# Patient Record
Sex: Female | Born: 1990 | Race: White | Marital: Married | Smoking: Never smoker
Health system: Southern US, Community
[De-identification: ages and names within clinical notes are randomized; demographics above are authoritative.]

## PROBLEM LIST (undated history)

## (undated) DIAGNOSIS — I10 Essential (primary) hypertension: Secondary | ICD-10-CM

## (undated) HISTORY — PX: WISDOM TOOTH EXTRACTION: SHX21

---

## 2017-03-09 ENCOUNTER — Emergency Department
Admission: EM | Admit: 2017-03-09 | Discharge: 2017-03-09 | Disposition: A | Discharge status: Home / Self Care | Payer: 59 | Attending: Emergency Medicine | Admitting: Emergency Medicine

## 2017-03-09 ENCOUNTER — Emergency Department: Payer: 59

## 2017-03-09 DIAGNOSIS — Z3A00 Weeks of gestation of pregnancy not specified: Secondary | ICD-10-CM | POA: Insufficient documentation

## 2017-03-09 DIAGNOSIS — O200 Threatened abortion: Secondary | ICD-10-CM | POA: Diagnosis not present

## 2017-03-09 DIAGNOSIS — I10 Essential (primary) hypertension: Secondary | ICD-10-CM

## 2017-03-09 DIAGNOSIS — N939 Abnormal uterine and vaginal bleeding, unspecified: Secondary | ICD-10-CM

## 2017-03-09 DIAGNOSIS — O209 Hemorrhage in early pregnancy, unspecified: Secondary | ICD-10-CM | POA: Diagnosis present

## 2017-03-09 HISTORY — DX: Essential (primary) hypertension: I10

## 2017-03-09 LAB — CBC WITH DIFFERENTIAL/PLATELET
BASOS ABS: 0 10*3/uL (ref 0–0.1)
BASOS PCT: 0 %
EOS ABS: 0.1 10*3/uL (ref 0–0.7)
Eosinophils Relative: 1 %
HCT: 37.8 % (ref 35.0–47.0)
HEMOGLOBIN: 12.8 g/dL (ref 12.0–16.0)
LYMPHS ABS: 1.7 10*3/uL (ref 1.0–3.6)
Lymphocytes Relative: 19 %
MCH: 28.5 pg (ref 26.0–34.0)
MCHC: 33.9 g/dL (ref 32.0–36.0)
MCV: 84 fL (ref 80.0–100.0)
Monocytes Absolute: 0.7 10*3/uL (ref 0.2–0.9)
Monocytes Relative: 8 %
NEUTROS PCT: 72 %
Neutro Abs: 6.2 10*3/uL (ref 1.4–6.5)
Platelets: 299 10*3/uL (ref 150–440)
RBC: 4.49 MIL/uL (ref 3.80–5.20)
RDW: 14.2 % (ref 11.5–14.5)
WBC: 8.8 10*3/uL (ref 3.6–11.0)

## 2017-03-09 LAB — COMPREHENSIVE METABOLIC PANEL
ALT: 14 U/L (ref 14–54)
AST: 27 U/L (ref 15–41)
Albumin: 3.8 g/dL (ref 3.5–5.0)
Alkaline Phosphatase: 59 U/L (ref 38–126)
Anion gap: 6 (ref 5–15)
BUN: 17 mg/dL (ref 6–20)
CHLORIDE: 106 mmol/L (ref 101–111)
CO2: 25 mmol/L (ref 22–32)
CREATININE: 0.79 mg/dL (ref 0.44–1.00)
Calcium: 8.7 mg/dL — ABNORMAL LOW (ref 8.9–10.3)
GFR calc Af Amer: >60 mL/min (ref >60)
Glucose, Bld: 93 mg/dL (ref 65–99)
Potassium: 5.1 mmol/L (ref 3.5–5.1)
Sodium: 137 mmol/L (ref 135–145)
Total Bilirubin: 0.5 mg/dL (ref 0.3–1.2)
Total Protein: 7.4 g/dL (ref 6.5–8.1)

## 2017-03-09 LAB — URINALYSIS, COMPLETE (UACMP) WITH MICROSCOPIC
Bacteria, UA: NONE SEEN
Bilirubin Urine: NEGATIVE
GLUCOSE, UA: NEGATIVE mg/dL
Ketones, ur: NEGATIVE mg/dL
LEUKOCYTES UA: NEGATIVE
Nitrite: NEGATIVE
PROTEIN: NEGATIVE mg/dL
Specific Gravity, Urine: 1.012 (ref 1.005–1.030)
pH: 6 (ref 5.0–8.0)

## 2017-03-09 LAB — CHLAMYDIA/NGC RT PCR (ARMC ONLY)
Chlamydia Tr: NOT DETECTED
N GONORRHOEAE: NOT DETECTED

## 2017-03-09 LAB — ABO/RH: ABO/RH(D): A POS

## 2017-03-09 LAB — WET PREP, GENITAL
Clue Cells Wet Prep HPF POC: NONE SEEN
SPERM: NONE SEEN
Trich, Wet Prep: NONE SEEN
Yeast Wet Prep HPF POC: NONE SEEN

## 2017-03-09 LAB — PREGNANCY, URINE: PREG TEST UR: POSITIVE — AB

## 2017-03-09 LAB — POCT PREGNANCY, URINE: PREG TEST UR: POSITIVE — AB

## 2017-03-09 LAB — HCG, QUANTITATIVE, PREGNANCY: hCG, Beta Chain, Quant, S: 157 m[IU]/mL — ABNORMAL HIGH (ref <5)

## 2017-03-09 NOTE — ED Triage Notes (Signed)
Pt c/o vaginal bleeding that started today, states she is unknown weeks pregnant , has not officially seen OB yet..Marland Kitchen

## 2017-03-09 NOTE — Consult Note (Signed)
Obstetrics & Gynecology Consult H&P    Consulting Department: Emergency Department  Consulting Physician: Corinna Capra, MD  Consulting Question: Possible ectopic pregnancy   History of Present Illness: Patient is a 26 y.o. G2P1001 with know early pregnancy presenting to the emergency department for vaginal bleeding.  The patient had previously been seen at an outside hospital system with inappropriate rise in HCG levels from 202 on 03/04/17 to 272 on 03/06/2017.    She presents to the emergency department today with a chief complaint of bleeding.  Denies abdominal pain.  She was on OCP until about a month ago.  This is a desired pregnancy.  The patient has not risk factors for ectopic pregnancy such as prior history of GC/CT, PID, endometriosis, or prior pelvic surgery.  Her prior pregnancy was uncomplicated other than blood pressure issues in the third trimester and ended in a SVD.  The patient is A positive per review of prior records.    Review of Systems:10 point review of systems  Past Medical History:  Past Medical History:  Diagnosis Date  . Hypertension    during pregnancy    Past Surgical History:  Past Surgical History:  Procedure Laterality Date  . WISDOM TOOTH EXTRACTION     Obstetric History: G2P1001  Family History:  No family history on file.  Social History:  Social History   Social History  . Marital status: Married    Spouse name: N/A  . Number of children: N/A  . Years of education: N/A   Occupational History  . Not on file.   Social History Main Topics  . Smoking status: Never Smoker  . Smokeless tobacco: Never Used  . Alcohol use No  . Drug use: Unknown  . Sexual activity: Not on file   Other Topics Concern  . Not on file   Social History Narrative  . No narrative on file    Allergies:  No Known Allergies  Medications: Prior to Admission medications   Not on File    Physical Exam Vitals: Blood pressure (!) 145/80, pulse 82,  temperature 98.1 F (36.7 C), temperature source Oral, resp. rate 11, height 5' 5"  (1.651 m), weight 225 lb (102.1 kg), SpO2 100 %. General: NAD HEENT: normocephalic, anicteric Pulmonary: No increased work of breathing Abdomen: NABS, soft, non-tender, non-distended, no rebound, no guarding Genitourinary: deferred Extremities: no edema, erythema, or tenderness Neurologic: Grossly intact Psychiatric: mood appropriate, affect full  Labs: Results for orders placed or performed during the hospital encounter of 03/09/17 (from the past 72 hour(s))  Comprehensive metabolic panel     Status: Abnormal   Collection Time: 03/09/17 11:21 AM  Result Value Ref Range   Sodium 137 135 - 145 mmol/L   Potassium 5.1 3.5 - 5.1 mmol/L    Comment: HEMOLYSIS AT THIS LEVEL MAY AFFECT RESULT   Chloride 106 101 - 111 mmol/L   CO2 25 22 - 32 mmol/L   Glucose, Bld 93 65 - 99 mg/dL   BUN 17 6 - 20 mg/dL   Creatinine, Ser 0.79 0.44 - 1.00 mg/dL   Calcium 8.7 (L) 8.9 - 10.3 mg/dL   Total Protein 7.4 6.5 - 8.1 g/dL   Albumin 3.8 3.5 - 5.0 g/dL   AST 27 15 - 41 U/L   ALT 14 14 - 54 U/L   Alkaline Phosphatase 59 38 - 126 U/L   Total Bilirubin 0.5 0.3 - 1.2 mg/dL   GFR calc non Af Amer >60 >60 mL/min  GFR calc Af Amer >60 >60 mL/min    Comment: (NOTE) The eGFR has been calculated using the CKD EPI equation. This calculation has not been validated in all clinical situations. eGFR's persistently <60 mL/min signify possible Chronic Kidney Disease.    Anion gap 6 5 - 15  CBC with Differential     Status: None   Collection Time: 03/09/17 11:21 AM  Result Value Ref Range   WBC 8.8 3.6 - 11.0 K/uL   RBC 4.49 3.80 - 5.20 MIL/uL   Hemoglobin 12.8 12.0 - 16.0 g/dL   HCT 37.8 35.0 - 47.0 %   MCV 84.0 80.0 - 100.0 fL   MCH 28.5 26.0 - 34.0 pg   MCHC 33.9 32.0 - 36.0 g/dL   RDW 14.2 11.5 - 14.5 %   Platelets 299 150 - 440 K/uL   Neutrophils Relative % 72 %   Neutro Abs 6.2 1.4 - 6.5 K/uL   Lymphocytes Relative  19 %   Lymphs Abs 1.7 1.0 - 3.6 K/uL   Monocytes Relative 8 %   Monocytes Absolute 0.7 0.2 - 0.9 K/uL   Eosinophils Relative 1 %   Eosinophils Absolute 0.1 0 - 0.7 K/uL   Basophils Relative 0 %   Basophils Absolute 0.0 0 - 0.1 K/uL  Pregnancy, urine     Status: Abnormal   Collection Time: 03/09/17 11:21 AM  Result Value Ref Range   Preg Test, Ur POSITIVE (A) NEGATIVE  Urinalysis, Complete w Microscopic     Status: Abnormal   Collection Time: 03/09/17 11:21 AM  Result Value Ref Range   Color, Urine YELLOW (A) YELLOW   APPearance CLEAR (A) CLEAR   Specific Gravity, Urine 1.012 1.005 - 1.030   pH 6.0 5.0 - 8.0   Glucose, UA NEGATIVE NEGATIVE mg/dL   Hgb urine dipstick MODERATE (A) NEGATIVE   Bilirubin Urine NEGATIVE NEGATIVE   Ketones, ur NEGATIVE NEGATIVE mg/dL   Protein, ur NEGATIVE NEGATIVE mg/dL   Nitrite NEGATIVE NEGATIVE   Leukocytes, UA NEGATIVE NEGATIVE   RBC / HPF 0-5 0 - 5 RBC/hpf   WBC, UA 0-5 0 - 5 WBC/hpf   Bacteria, UA NONE SEEN NONE SEEN   Squamous Epithelial / LPF 0-5 (A) NONE SEEN  hCG, quantitative, pregnancy     Status: Abnormal   Collection Time: 03/09/17 11:21 AM  Result Value Ref Range   hCG, Beta Chain, Quant, S 157 (H) <5 mIU/mL    Comment:          GEST. AGE      CONC.  (mIU/mL)   <=1 WEEK        5 - 50     2 WEEKS       50 - 500     3 WEEKS       100 - 10,000     4 WEEKS     1,000 - 30,000     5 WEEKS     3,500 - 115,000   6-8 WEEKS     12,000 - 270,000    12 WEEKS     15,000 - 220,000        FEMALE AND NON-PREGNANT FEMALE:     LESS THAN 5 mIU/mL   ABO/Rh     Status: None   Collection Time: 03/09/17 11:21 AM  Result Value Ref Range   ABO/RH(D) A POS   Pregnancy, urine POC     Status: Abnormal   Collection Time: 03/09/17 11:32 AM  Result Value  Ref Range   Preg Test, Ur POSITIVE (A) NEGATIVE    Comment:        THE SENSITIVITY OF THIS METHODOLOGY IS >24 mIU/mL   Wet prep, genital     Status: Abnormal   Collection Time: 03/09/17 11:37 AM    Result Value Ref Range   Yeast Wet Prep HPF POC NONE SEEN NONE SEEN   Trich, Wet Prep NONE SEEN NONE SEEN   Clue Cells Wet Prep HPF POC NONE SEEN NONE SEEN   WBC, Wet Prep HPF POC TOO NUMEROUS TO COUNT (A) NONE SEEN   Sperm NONE SEEN   Chlamydia/NGC rt PCR ([facility] only)     Status: None   Collection Time: 03/09/17 11:37 AM  Result Value Ref Range   Specimen source GC/Chlam ENDOCERVICAL    Chlamydia Tr NOT DETECTED NOT DETECTED   N gonorrhoeae NOT DETECTED NOT DETECTED    Comment: (NOTE) 100  This methodology has not been evaluated in pregnant women or in 200  patients with a history of hysterectomy. 300 400  This methodology will not be performed on patients less than 42  years of age.     Imaging US Ob Comp Less 14 Wks  Result Date: 03/09/2017 CLINICAL DATA:  26 year old pregnant female presenting with vaginal bleeding since this morning. Quantitative beta HCG 175. Uncertain LMP. EXAM: OBSTETRIC <14 WK [place] AND TRANSVAGINAL OB US TECHNIQUE: Both transabdominal and transvaginal ultrasound examinations were performed for complete evaluation of the gestation as well as the maternal uterus, adnexal regions, and pelvic cul-de-sac. Transvaginal technique was performed to assess early pregnancy. COMPARISON:  None. FINDINGS: Uterus is retroverted. No intrauterine gestational sac. Bilayer endometrial thickness 8 mm. No endometrial cavity fluid or focal endometrial mass. No uterine fibroids or other myometrial abnormality is demonstrated . Right ovary measures 3.6 x 2.0 x 2.2 cm. There is a mixed echogenicity 2.0 x 0.8 x 1.4 cm right adnexal mass located between the right ovary and uterus with tiny internal cystic spaces and mild internal vascularity on color Doppler. No discrete embryo, yolk sac or embryonic cardiac activity detected within the right adnexal mass. Left ovary measures 3.8 x 2.0 x 1.6 cm. No abnormal left ovarian or left adnexal mass. No abnormal free fluid in the pelvis. IMPRESSION: 1.  No intrauterine gestation. 2. Mixed echogenicity 2.0 x 0.8 x 1.4 cm right adnexal mass located between the right ovary and uterus, suspicious for a right tubal ectopic gestation. No discrete embryo or yolk sac detected within the right adnexal mass. 3. No abnormal free fluid in the pelvis. These results were called by telephone at the time of interpretation on 03/09/2017 at 1:08 pm to Dr. Conni Slipper , who verbally acknowledged these results. Electronically Signed   By: Ilona Sorrel M.D.   On: 03/09/2017 13:11   US Ob Transvaginal  Result Date: 03/09/2017 CLINICAL DATA:  26 year old pregnant female presenting with vaginal bleeding since this morning. Quantitative beta HCG 175. Uncertain LMP. EXAM: OBSTETRIC <14 WK [place] AND TRANSVAGINAL OB US TECHNIQUE: Both transabdominal and transvaginal ultrasound examinations were performed for complete evaluation of the gestation as well as the maternal uterus, adnexal regions, and pelvic cul-de-sac. Transvaginal technique was performed to assess early pregnancy. COMPARISON:  None. FINDINGS: Uterus is retroverted. No intrauterine gestational sac. Bilayer endometrial thickness 8 mm. No endometrial cavity fluid or focal endometrial mass. No uterine fibroids or other myometrial abnormality is demonstrated . Right ovary measures 3.6 x 2.0 x 2.2 cm. There is a mixed echogenicity  2.0 x 0.8 x 1.4 cm right adnexal mass located between the right ovary and uterus with tiny internal cystic spaces and mild internal vascularity on color Doppler. No discrete embryo, yolk sac or embryonic cardiac activity detected within the right adnexal mass. Left ovary measures 3.8 x 2.0 x 1.6 cm. No abnormal left ovarian or left adnexal mass. No abnormal free fluid in the pelvis. IMPRESSION: 1. No intrauterine gestation. 2. Mixed echogenicity 2.0 x 0.8 x 1.4 cm right adnexal mass located between the right ovary and uterus, suspicious for a right tubal ectopic gestation. No discrete embryo or yolk sac  detected within the right adnexal mass. 3. No abnormal free fluid in the pelvis. These results were called by telephone at the time of interpretation on 03/09/2017 at 1:08 pm to Dr. Conni Slipper , who verbally acknowledged these results. Electronically Signed   By: Ilona Sorrel M.D.   On: 03/09/2017 13:11    Assessment: 26 y.o. G2P1001 with pregnancy of unknown location  Plan: 1) Pregnancy of unknown location: - recommend continued trending of HCG since we are seeing level drop - Rh positive rhogam is not indicated - If plateau in level patient is a MTX candidate, we also discussed salpingostomy, and salpingectomy.  Ultrasound images independently reviewed by myself showing EMS of 17m, the right adenxa contains a simple cyst,  No significant blood flow, no free fluid - Ectopic precautions given in regard to dizziness, worsening bleeding, or development of acute onset abdominal pain which warrant re-presentation  The patient does not have identifiable risk factors which would increase her risk of ectopic pregnancy (Approximately 50% of all patient presenting with ectopic pregnancy will have identifiable risk factors such as prior GC/CT, PID, or ART).  The discriminatory zone for visualizing a singleton intrauterine pregnancy is an HCG level of approximately 15032m/mL to 250032mmL, although the recommended value to rule out an intrauterine pregnancy has been set conservatively high at 3500m65mL to account for twin gestations which occur at a rate of approximately 2% of all spontaneous conceptions.  Serial HCG measurements are more helpful in determining the patients risk of ectopic than a single value.  Demonstration of adnexal mass with a hypoechoic center on ultrasound has a positive predictive value of approximately 80% and may misidentify corpus luteum cysts, paratubal cysts, and or hydrosalpinx among others.  An intrauterine gestational sac makes ectopic pregnancy less likely but may represent  pseudosac.  The diagnosis of intrauterine pregnancy requires documentation of an intrauterine gestational sac with accompanying yolk sac or fetal pole.  Heterotopic pregnancy is exceedingly rare but may still be present in cases of confirmed intrauterine pregnancy with a reported incidence of 1 in 4,000 to 1 in 30,000.   Ruptured ectopic pregnancy continued to account for 2.7% of all maternal deaths, and the incidence of ectopic pregnancy is reported at approximately 2% of all spontaneous conceptions.  The patient understand the importance of returning for repeat HCG measurement in 48-hrs, as well as follow up ultrasound.  She is currently hemodynamically stable, with a non-acute pelvic and abdominal exam, and reliable.  This is a desired pregnancy.  She was given strict precautions should she develop acute abdominal pain and or orthostatic symptoms.  ACOG Practice Bulletin 191 February 2018 "Ectopic Pregnancy"  [email or web address]

## 2017-03-09 NOTE — Discharge Instructions (Signed)
[facility]. Please be sure to go to the emergency room immediately for heavy bleeding worse pain feeling lightheaded etc. When you go to see your OB doctor please take the records and the CD with the ultrasound pictures on it which you were given here. Please follow all the advice of Dr. Chauncey CruelStabler gave you. Remember this could still be an ectopic pregnancy (pregnancy in the tubes).

## 2017-03-09 NOTE — ED Provider Notes (Signed)
[facility] Emergency Department Provider Note   ____________________________________________   First MD Initiated Contact with Patient 03/09/17 1058     (approximate)  I have reviewed the triage vital signs and the nursing notes.   HISTORY  Chief Complaint Vaginal Bleeding    HPI Alicia Pratt is a 26 y.o. female who is visiting from [place]. She reports she noticed some vaginal bleeding this morning. Was more than just spotting but not much more. She reports she had some bleeding for a few days in February which she thought she was her menstrual period but then discovered she was pregnant about 2-3 days later. She reports she had bleeding with her first pregnancy and was diagnosed as having low progesterone levels. She had to take progesterone suppositories. At pregnancy ended successfully and her baby boy is almost 28-year-old.Patient says her belly slightly achy but she is not having any cramping nausea vomiting fevers or any other more serious pain.  Past Medical History:  Diagnosis Date  . Hypertension    during pregnancy    There are no active problems to display for this patient.   Past Surgical History:  Procedure Laterality Date  . WISDOM TOOTH EXTRACTION      Prior to Admission medications   Not on File    Allergies Patient has no known allergies.  No family history on file.  Social History Social History  Substance Use Topics  . Smoking status: Never Smoker  . Smokeless tobacco: Never Used  . Alcohol use No    Review of Systems Constitutional: No fever/chills Eyes: No visual changes. ENT: No sore throat. Cardiovascular: Denies chest pain. Respiratory: Denies shortness of breath. Gastrointestinal:   No nausea, no vomiting.  No diarrhea.  No constipation. Genitourinary: Negative for dysuria. Musculoskeletal: Negative for back pain. Skin: Negative for rash. Neurological: Negative for headaches, focal weakness or  numbness.  10-point ROS otherwise negative.  ____________________________________________   PHYSICAL EXAM:  VITAL SIGNS: ED Triage Vitals  Enc Vitals Group     BP 03/09/17 1053 130/78     Pulse Rate 03/09/17 1051 94     Resp 03/09/17 1051 18     Temp 03/09/17 1051 98.1 F (36.7 C)     Temp Source 03/09/17 1051 Oral     SpO2 03/09/17 1051 98 %     Weight 03/09/17 1051 225 lb (102.1 kg)     Height 03/09/17 1051 5\' 5"  (1.651 m)     Head Circumference --      Peak Flow --      Pain Score 03/09/17 1052 1     Pain Loc --      Pain Edu? --      Excl. in GC? --    Constitutional: Alert and oriented. Well appearing and in no acute distress. Eyes: Conjunctivae are normal. PERRL. EOMI. Head: Atraumatic. Nose: No congestion/rhinnorhea. Mouth/Throat: Mucous membranes are moist.  Oropharynx non-erythematous. Neck: No stridor.  Cardiovascular: Normal rate, regular rhythm. Grossly normal heart sounds.  Good peripheral circulation. Respiratory: Normal respiratory effort.  No retractions. Lungs CTAB. Gastrointestinal: Soft and nontender. No distention. No abdominal bruits. No CVA tenderness. Genitourinary: Normal peritoneum there is a scant amount of blood in the vagina but no discharge visible. Cervix is closed there are no palpable masses and no cervical motion tenderness no adnexal tenderness  Musculoskeletal: No lower extremity tenderness nor edema.  No joint effusions. Neurologic:  Normal speech and language. No gross focal neurologic deficits are appreciated. No  gait instability. Skin:  Skin is warm, dry and intact. No rash noted.  ____________________________________________   LABS (all labs ordered are listed, but only abnormal results are displayed)  Labs Reviewed  WET PREP, GENITAL - Abnormal; Notable for the following:       Result Value   WBC, Wet Prep HPF POC TOO NUMEROUS TO COUNT (*)    All other components within normal limits  COMPREHENSIVE METABOLIC PANEL -  Abnormal; Notable for the following:    Calcium 8.7 (*)    All other components within normal limits  PREGNANCY, URINE - Abnormal; Notable for the following:    Preg Test, Ur POSITIVE (*)    All other components within normal limits  URINALYSIS, COMPLETE (UACMP) WITH MICROSCOPIC - Abnormal; Notable for the following:    Color, Urine YELLOW (*)    APPearance CLEAR (*)    Hgb urine dipstick MODERATE (*)    Squamous Epithelial / LPF 0-5 (*)    All other components within normal limits  HCG, QUANTITATIVE, PREGNANCY - Abnormal; Notable for the following:    hCG, Beta Chain, Quant, S 157 (*)    All other components within normal limits  POCT PREGNANCY, URINE - Abnormal; Notable for the following:    Preg Test, Ur POSITIVE (*)    All other components within normal limits  CHLAMYDIA/NGC RT PCR ([facility] ONLY)  CBC WITH DIFFERENTIAL/PLATELET  ABO/RH   ____________________________________________  EKG   ____________________________________________  RADIOLOGY  tudy Result   CLINICAL DATA:  26 year old pregnant female presenting with vaginal bleeding since this morning. Quantitative beta HCG 175. Uncertain LMP.  EXAM: OBSTETRIC <14 WK [place] AND TRANSVAGINAL OB US  TECHNIQUE: Both transabdominal and transvaginal ultrasound examinations were performed for complete evaluation of the gestation as well as the maternal uterus, adnexal regions, and pelvic cul-de-sac. Transvaginal technique was performed to assess early pregnancy.  COMPARISON:  None.  FINDINGS: Uterus is retroverted. No intrauterine gestational sac. Bilayer endometrial thickness 8 mm. No endometrial cavity fluid or focal endometrial mass. No uterine fibroids or other myometrial abnormality is demonstrated .  Right ovary measures 3.6 x 2.0 x 2.2 cm. There is a mixed echogenicity 2.0 x 0.8 x 1.4 cm right adnexal mass located between the right ovary and uterus with tiny internal cystic spaces and  mild internal vascularity on color Doppler. No discrete embryo, yolk sac or embryonic cardiac activity detected within the right adnexal mass.  Left ovary measures 3.8 x 2.0 x 1.6 cm. No abnormal left ovarian or left adnexal mass. No abnormal free fluid in the pelvis.  IMPRESSION: 1. No intrauterine gestation. 2. Mixed echogenicity 2.0 x 0.8 x 1.4 cm right adnexal mass located between the right ovary and uterus, suspicious for a right tubal ectopic gestation. No discrete embryo or yolk sac detected within the right adnexal mass. 3. No abnormal free fluid in the pelvis. These results were called by telephone at the time of interpretation on 03/09/2017 at 1:08 pm to Dr. Dorothea Glassman , who verbally acknowledged these results.   Electronically Signed   By: Delbert Phenix M.D.   On: 03/09/2017 13:11     ____________________________________________   PROCEDURES  Procedure(s) performed:  Procedures  Critical Care performed:   ____________________________________________   INITIAL IMPRESSION / ASSESSMENT AND PLAN / ED COURSE  Pertinent labs & imaging results that were available during my care of the patient were reviewed by me and considered in my medical decision making (see chart for details).   Patient seen  by Dr. Chauncey Cruel GYN, feels the patient is okay to go home at present. He is not sure what this mass is. Patient and her husband  understand the risk this could still protect perhaps be an ectopic pregnancy as he explained to them.      ____________________________________________   FINAL CLINICAL IMPRESSION(S) / ED DIAGNOSES  Final diagnoses:  Vaginal bleeding  Threatened miscarriage      NEW MEDICATIONS STARTED DURING THIS VISIT:  New Prescriptions   No medications on file     Note:  This document was prepared using Dragon voice recognition software and may include unintentional dictation errors.    Arnaldo Natal, MD 03/09/17 1409

## 2017-05-17 IMAGING — US US OB COMP LESS 14 WK
1 series · 13 of 28 positions shown · non-contrast
Comparison: None.

CLINICAL DATA: 25-year-old pregnant female presenting with vaginal
bleeding since this morning. Quantitative beta HCG 175. Uncertain
LMP.

EXAM:
OBSTETRIC <14 WK US AND TRANSVAGINAL OB US
TECHNIQUE: Both transabdominal and transvaginal ultrasound examinations were
performed for complete evaluation of the gestation as well as the
maternal uterus, adnexal regions, and pelvic cul-de-sac.
Transvaginal technique was performed to assess early pregnancy.

[Series 1: us ob comp less 14 wk · 0.25mm/px · 90 acquisitions, 13 frames shown]
[im 4/90]
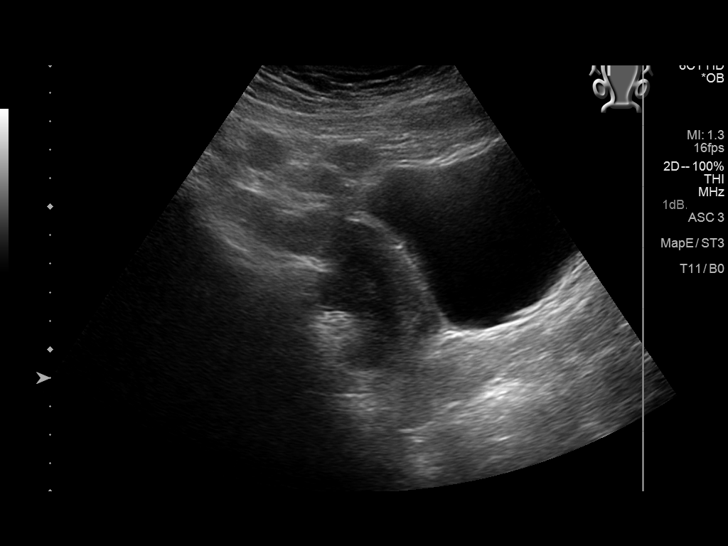
[im 10/90]
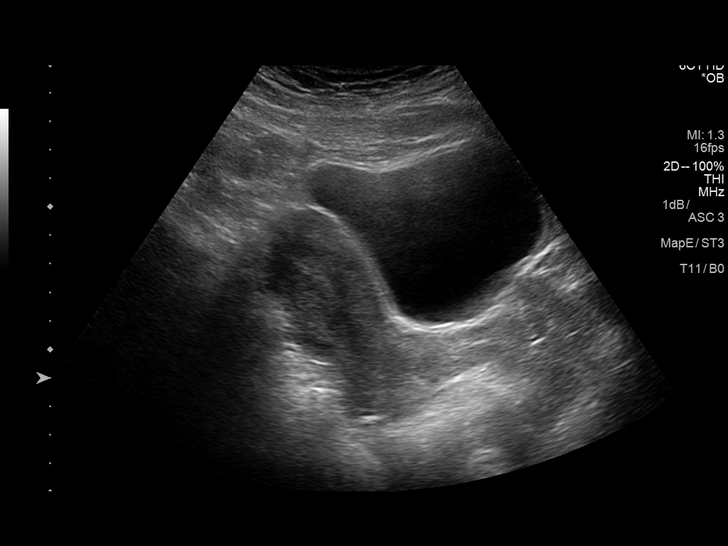
[im 17/90]
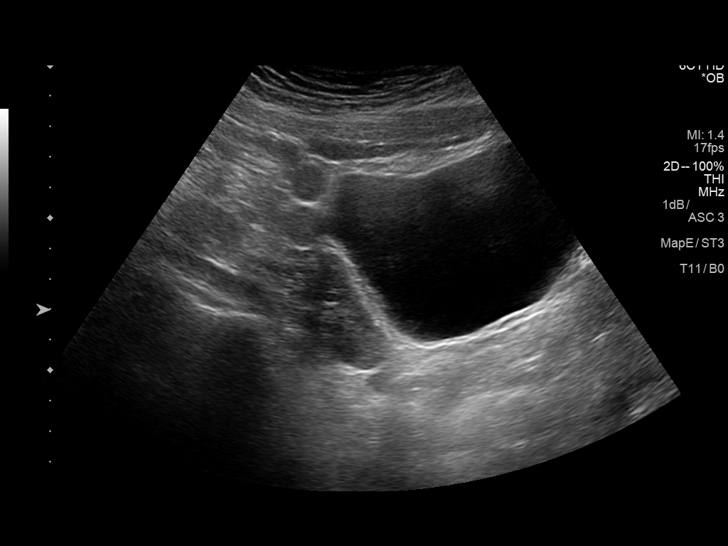
[im 24/90]
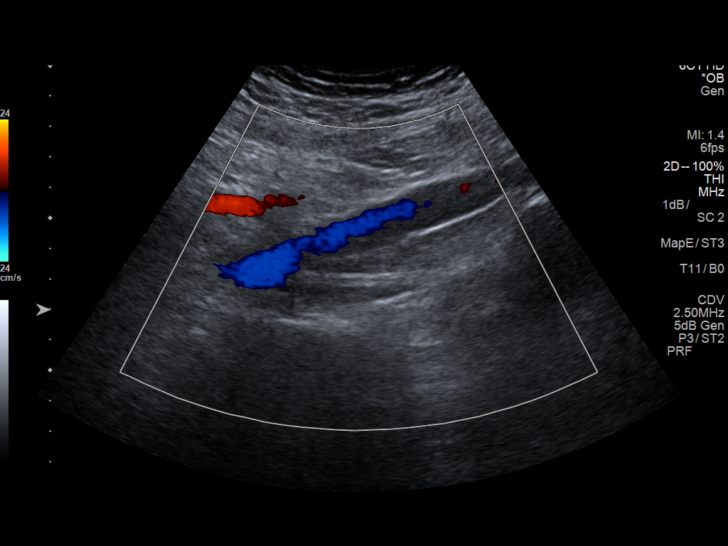
[im 30/90]
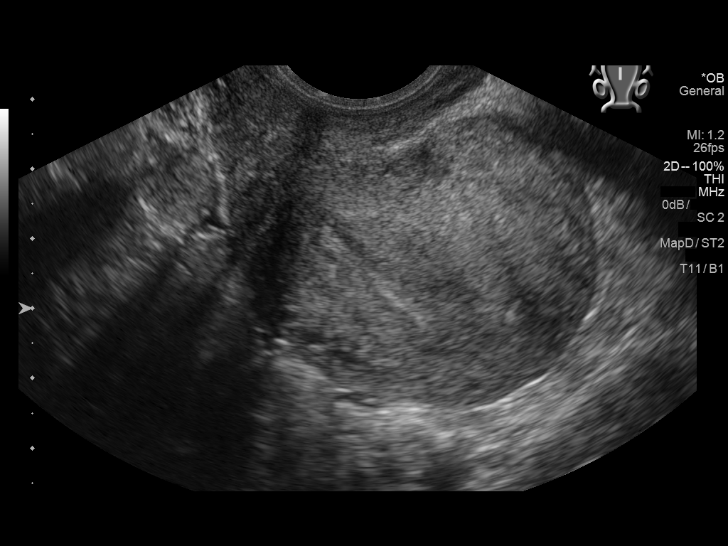
[im 37/90]
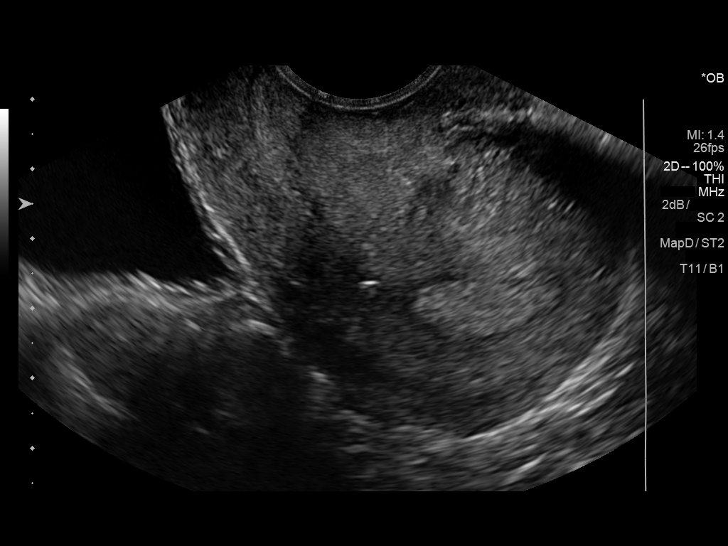
[im 47/90]
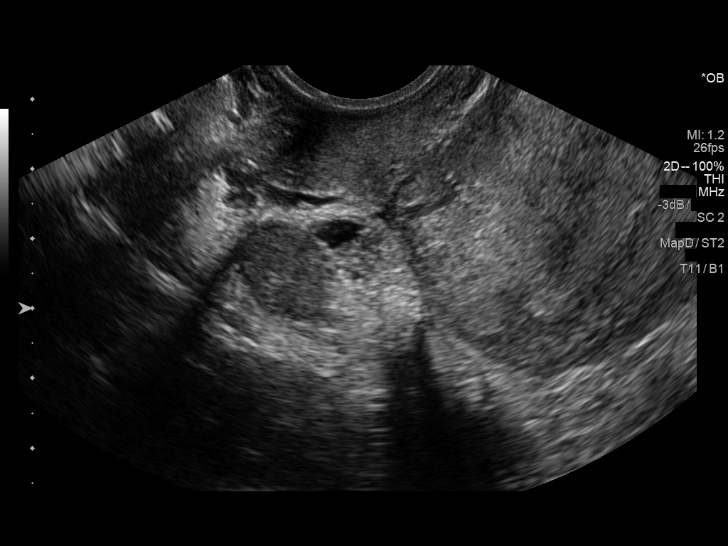
[im 53/90]
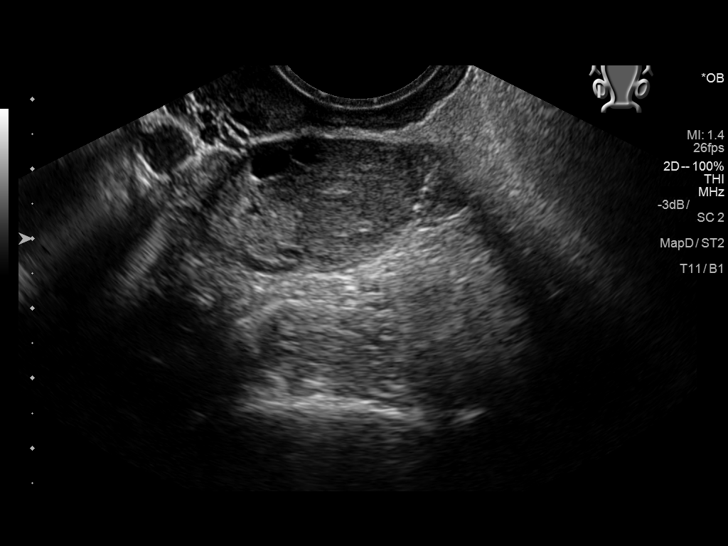
[im 60/90]
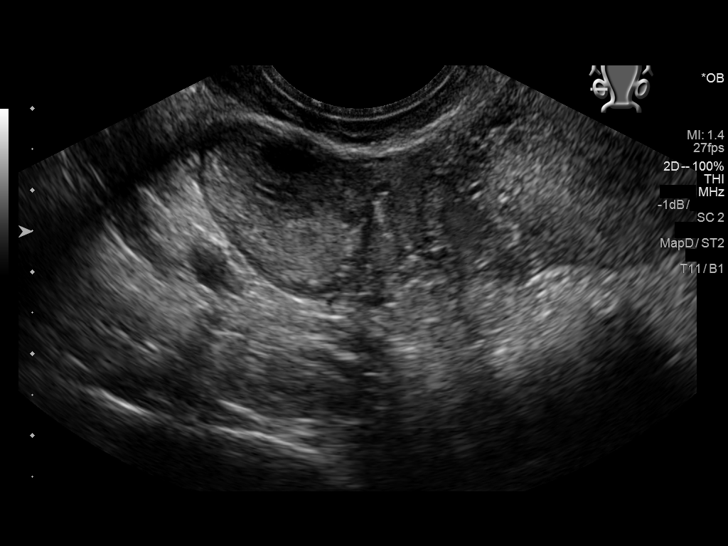
[im 66/90]
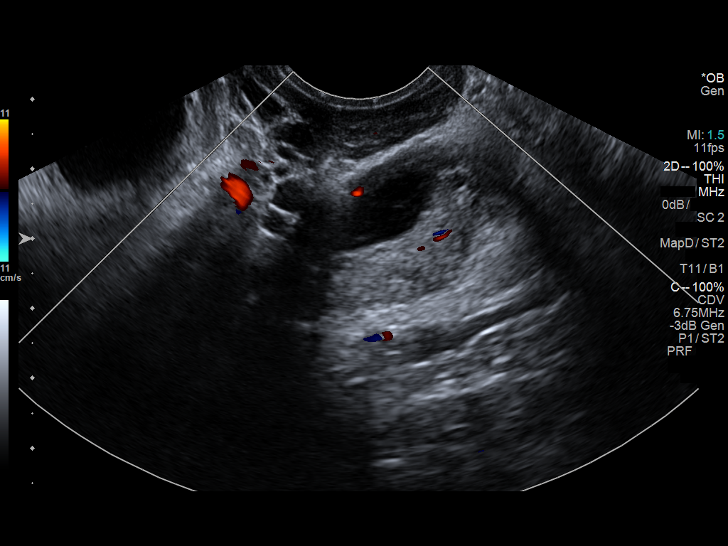
[im 73/90]
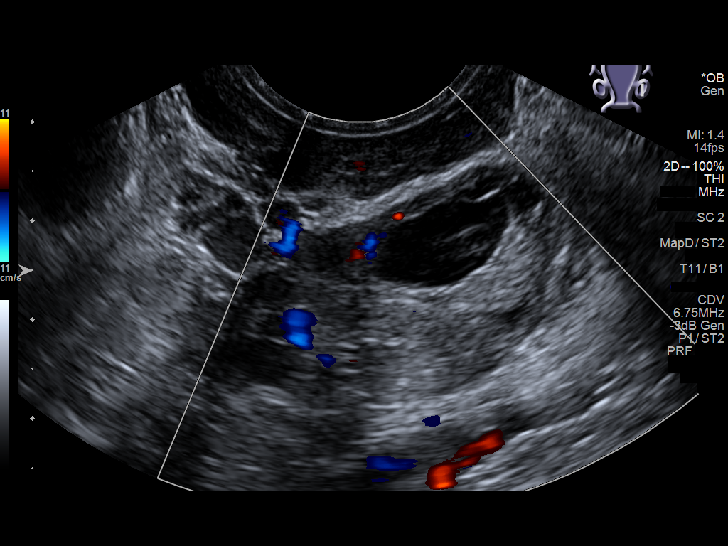
[im 80/90]
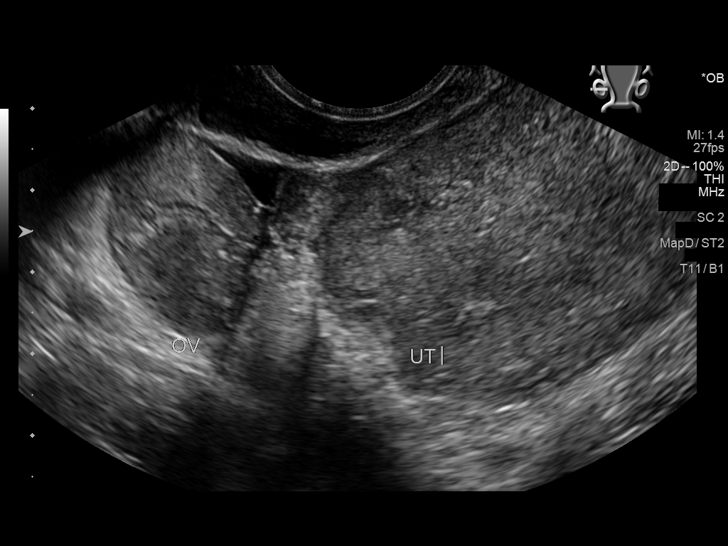
[im 86/90]
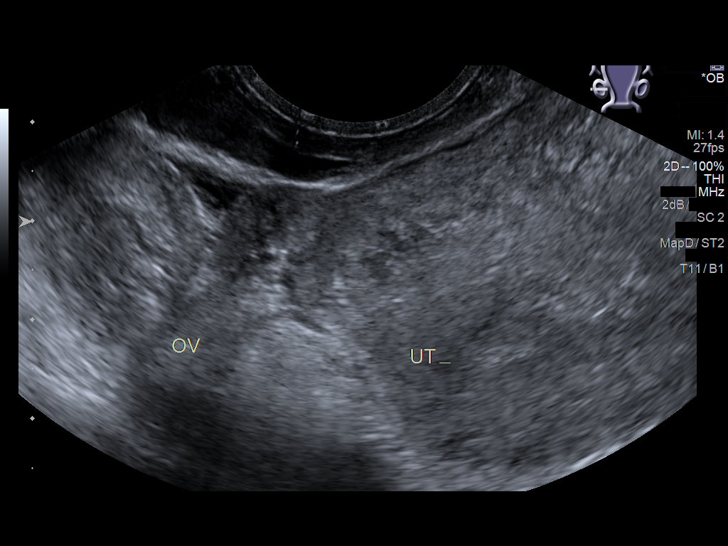

[13 of 28 positions shown; findings below may reference images not displayed]

FINDINGS: Uterus is retroverted. No intrauterine gestational sac. Bilayer
endometrial thickness 8 mm. No endometrial cavity fluid or focal
endometrial mass. No uterine fibroids or other myometrial
abnormality is demonstrated .

Right ovary measures 3.6 x 2.0 x 2.2 cm. There is a mixed
echogenicity 2.0 x 0.8 x 1.4 cm right adnexal mass located between
the right ovary and uterus with tiny internal cystic spaces and mild
internal vascularity on color Doppler. No discrete embryo, yolk sac
or embryonic cardiac activity detected within the right adnexal
mass.

Left ovary measures 3.8 x 2.0 x 1.6 cm. No abnormal left ovarian or
left adnexal mass. No abnormal free fluid in the pelvis.
IMPRESSION: 1. No intrauterine gestation.
2. Mixed echogenicity 2.0 x 0.8 x 1.4 cm right adnexal mass located
between the right ovary and uterus, suspicious for a right tubal
ectopic gestation. No discrete embryo or yolk sac detected within
the right adnexal mass.
3. No abnormal free fluid in the pelvis.
These results were called by telephone at the time of interpretation
on 03/09/2017 at [DATE] to Dr. ARDITA QOKTHI CIMO , who verbally
acknowledged these results.
